# Patient Record
Sex: Female | Born: 1980 | Race: White | Hispanic: No | Marital: Married | Smoking: Current every day smoker
Health system: Southern US, Community
[De-identification: ages and names within clinical notes are randomized; demographics above are authoritative.]

## PROBLEM LIST (undated history)

## (undated) DIAGNOSIS — I10 Essential (primary) hypertension: Secondary | ICD-10-CM

## (undated) DIAGNOSIS — F99 Mental disorder, not otherwise specified: Secondary | ICD-10-CM

## (undated) HISTORY — DX: Mental disorder, not otherwise specified: F99

## (undated) HISTORY — DX: Essential (primary) hypertension: I10

---

## 2003-12-16 ENCOUNTER — Emergency Department (HOSPITAL_COMMUNITY): Admission: EM | Admit: 2003-12-16 | Discharge: 2003-12-16 | Payer: Self-pay | Admitting: Emergency Medicine

## 2005-11-23 IMAGING — CT CT PELVIS W/O CM
1 series · 15 of 32 positions shown, 19 images · non-contrast
Comparison: none

CLINICAL DATA: Left flank pain.
 CT ABDOMEN WITHOUT CONTRAST

[Series 3: — · axial · 0.62mm/px · z∈[-394,-66]mm · 15 of 91 slices shown, 19 images]
[im 6/91  soft-tissue]
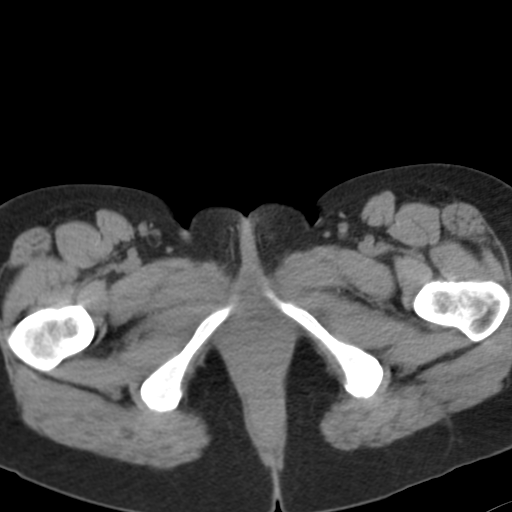
[im 6/91  bone]
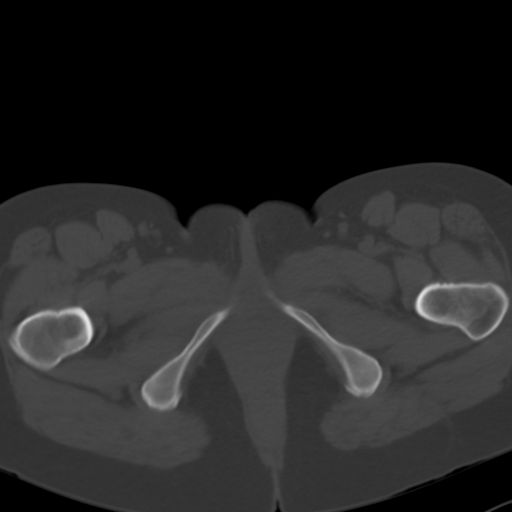
[im 12/91  soft-tissue]
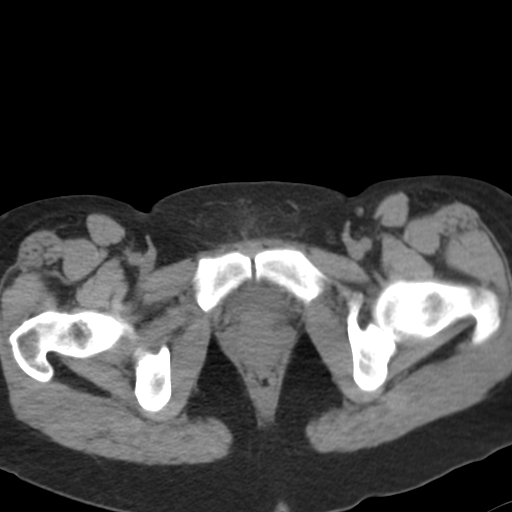
[im 18/91  soft-tissue]
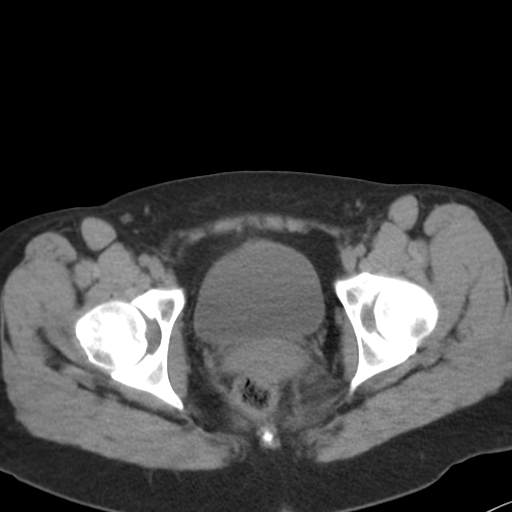
[im 27/91  soft-tissue]
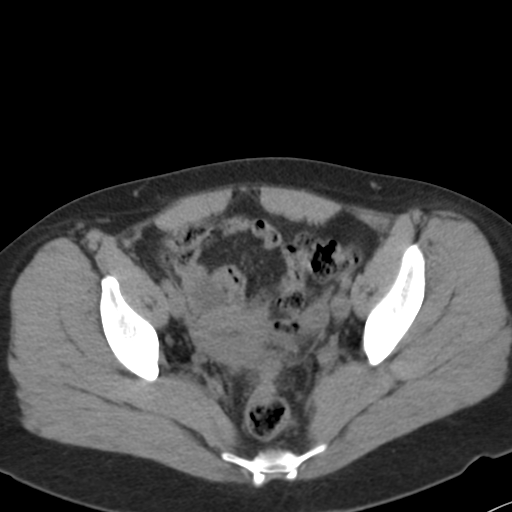
[im 32/91  soft-tissue]
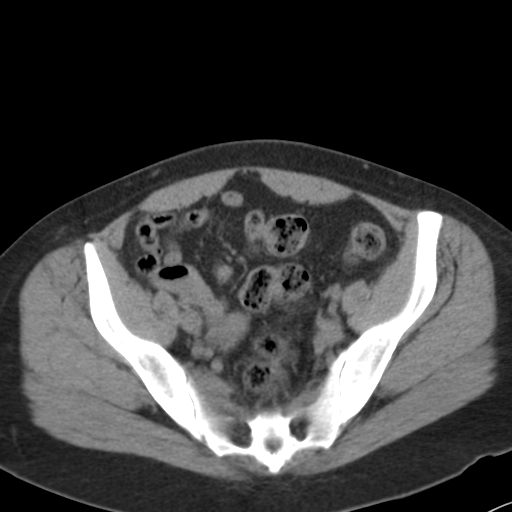
[im 38/91  soft-tissue]
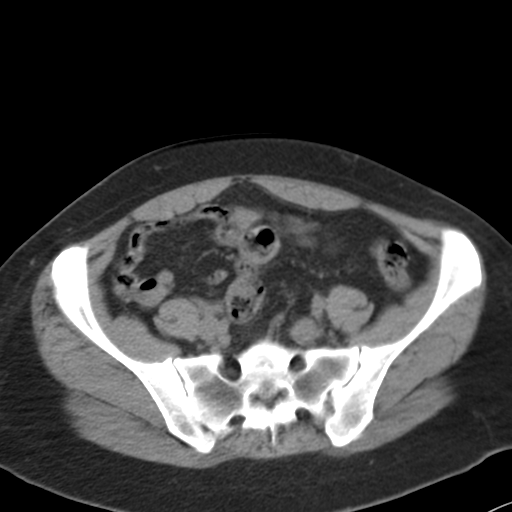
[im 47/91  soft-tissue]
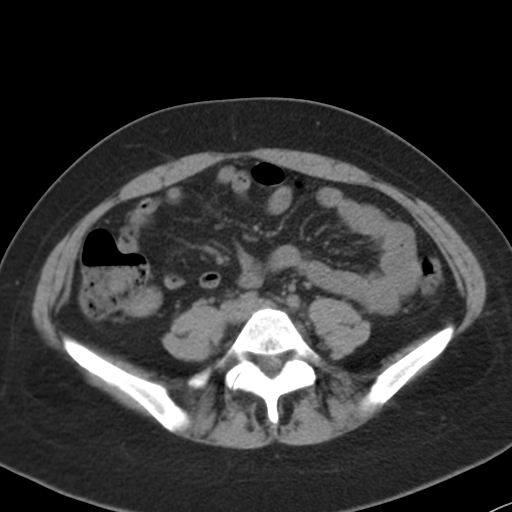
[im 53/91  soft-tissue]
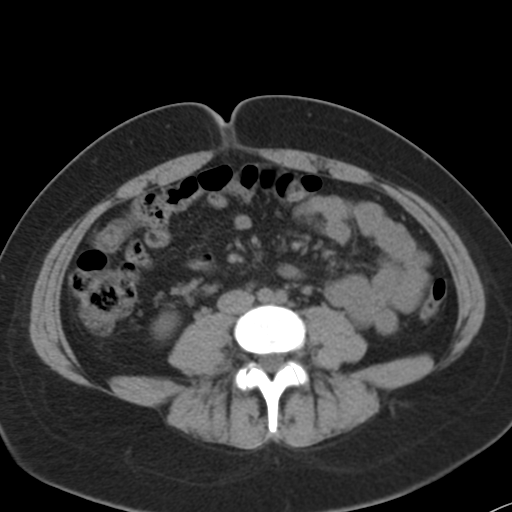
[im 59/91  soft-tissue]
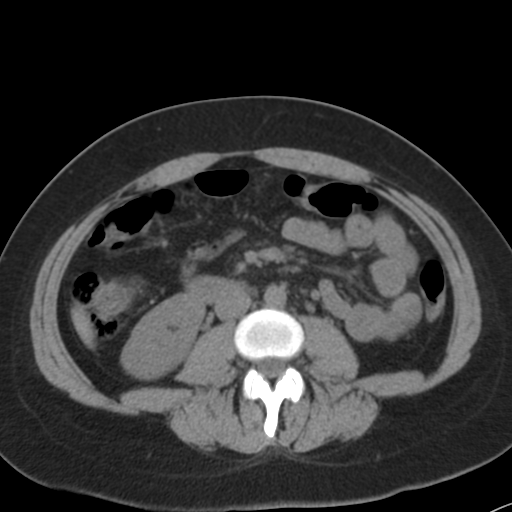
[im 59/91  bone]
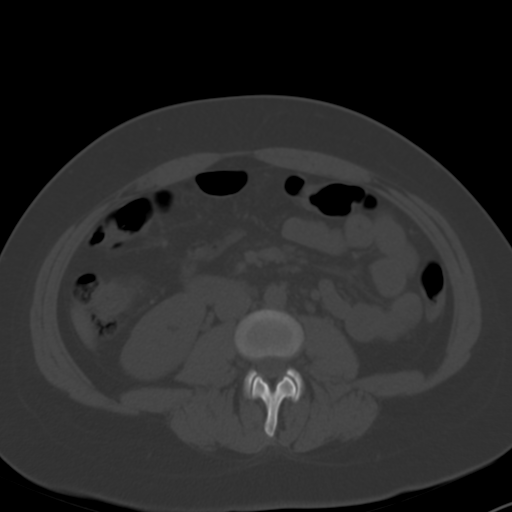
[im 64/91  soft-tissue]
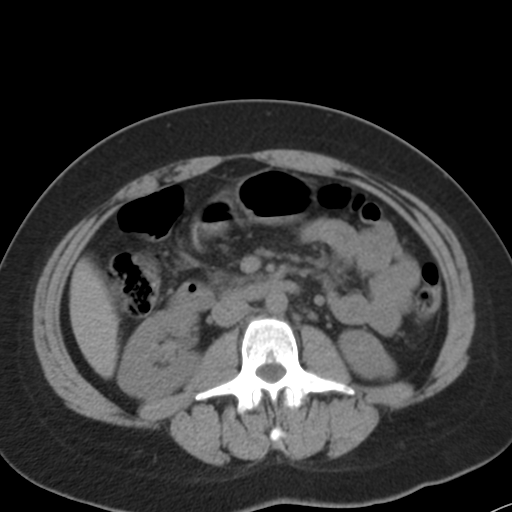
[im 73/91  soft-tissue]
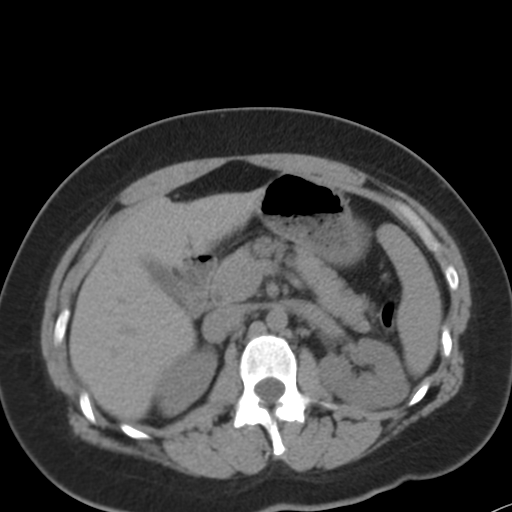
[im 79/91  soft-tissue]
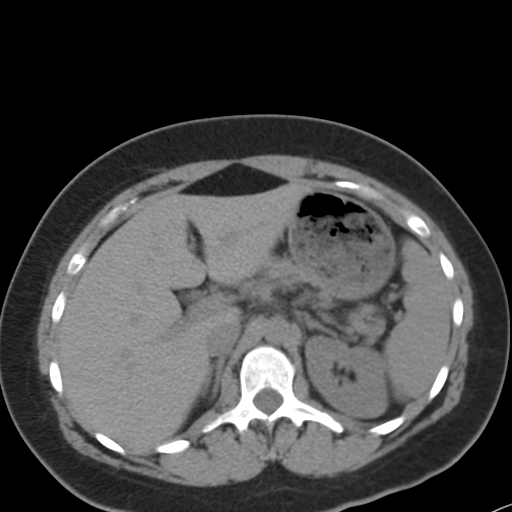
[im 79/91  lung]
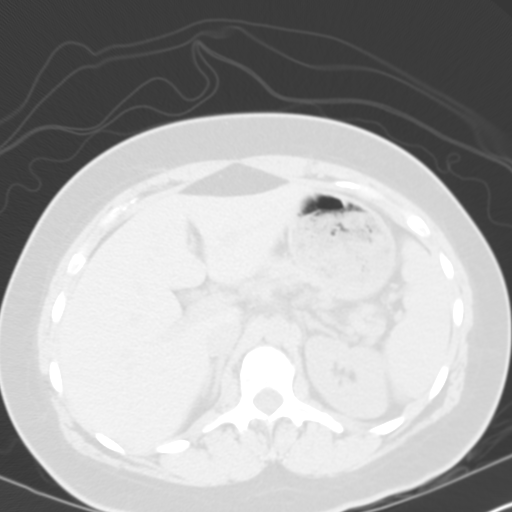
[im 82/91  lung]
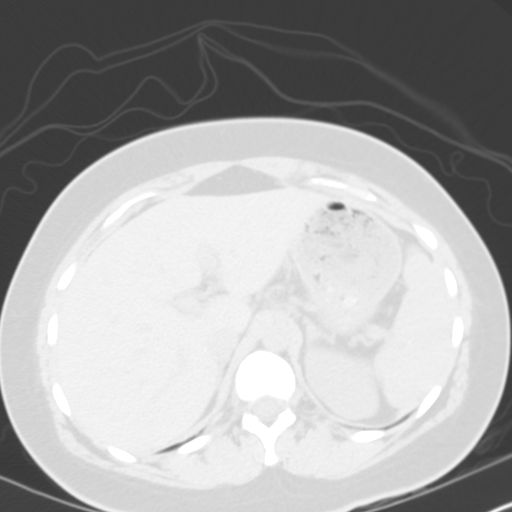
[im 85/91  soft-tissue]
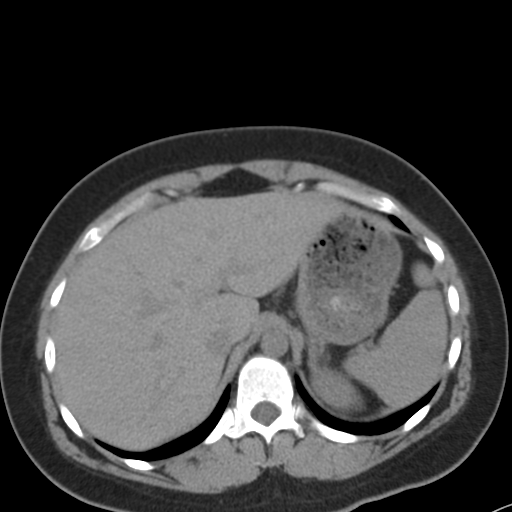
[im 85/91  lung]
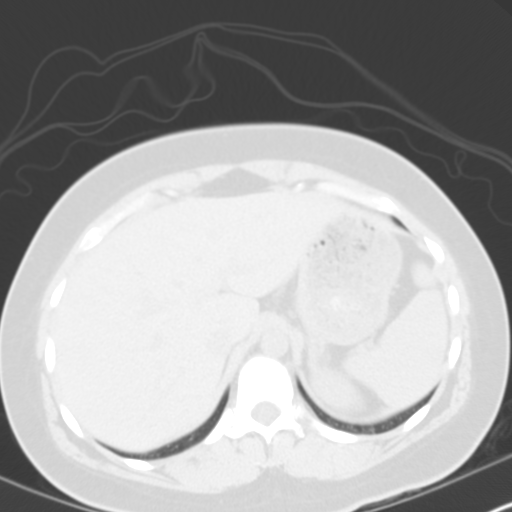
[im 88/91  lung]
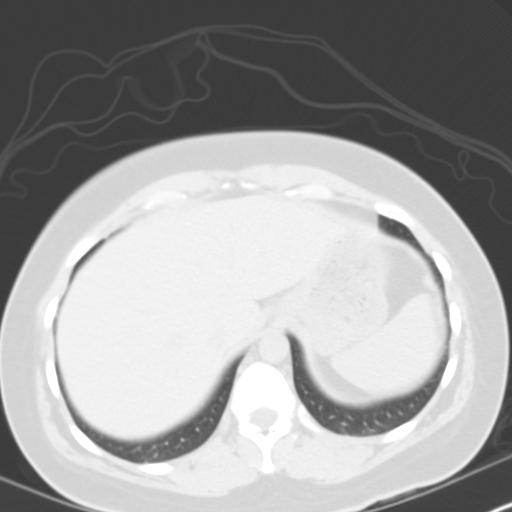

[15 of 32 positions shown; findings below may reference images not displayed]

FINDINGS: There is no previous for comparison.
 The kidneys are unremarkable without stone or hydronephrosis.  The proximal ureters are decompressed.  Unremarkable noncontrast evaluation of visualized portions of the liver and spleen, decompressed gallbladder, adrenal glands, and pancreas.  The aorta is normal in caliber.  The small bowel is decompressed.  No free air.  No ascites.  Left periaortic and aortocaval subcentimeter lymph nodes are incidentally noted.
 IMPRESSION
 Unremarkable CT of the abdomen.  Specifically, no evidence of renal calculus or hydronephrosis.
 CT PELVIS WITHOUT CONTRAST
FINDINGS: There is a phlebolith in the left pelvis.  There is a 2.8 cm ill-defined low attenuation area in the right adnexal region, possibly a cyst but incompletely evaluated.  The uterus and left adnexal region unremarkable.  No free fluid.  The urinary bladder is incompletely distended.  The colon is decompressed, unremarkable.  There is a single 7 mm lymph node projecting posterior to the cecum.  Subcentimeter inguinal lymph nodes are noted bilaterally.
 IMPRESSION
 1.  Possible right ovarian cyst versus mass.  Consider follow-up pelvic ultrasound to confirm appropriate resolution.
 2.  Negative for distal ureteral calculus.

## 2006-03-28 HISTORY — PX: CHOLECYSTECTOMY: SHX55

## 2007-11-30 ENCOUNTER — Ambulatory Visit: Payer: Self-pay | Admitting: Psychiatry

## 2007-11-30 ENCOUNTER — Inpatient Hospital Stay (HOSPITAL_COMMUNITY): Admission: AD | Admit: 2007-11-30 | Discharge: 2007-12-04 | Payer: Self-pay | Admitting: Psychiatry

## 2008-12-31 ENCOUNTER — Encounter: Payer: Self-pay | Admitting: Gastroenterology

## 2009-08-21 ENCOUNTER — Ambulatory Visit (HOSPITAL_COMMUNITY): Admission: RE | Admit: 2009-08-21 | Discharge: 2009-08-21 | Payer: Self-pay | Admitting: Obstetrics and Gynecology

## 2010-08-10 NOTE — H&P (Signed)
[name], [name]              ACCOUNT NO.:  [id number]   MEDICAL RECORD NO.:  [id number]          PATIENT TYPE:  IPS   LOCATION:  0501                          FACILITY:  [facility]   PHYSICIAN:  Geoffery Lyons, M.D.      DATE OF BIRTH:  May 30, 1980   DATE OF ADMISSION:  11/30/2007  DATE OF DISCHARGE:                       PSYCHIATRIC ADMISSION ASSESSMENT   IDENTIFYING INFORMATION:  This is a voluntary admission to the services  of Dr. Geoffery Lyons.  This is a 30 year old single white female.  She  presented at [facility] emergency department yesterday.  She stated that  when she gets depressed she stupid stuff.  She reportedly bought some  crack cocaine and used it yesterday.  Her boyfriend stated that if she  did not get help he would throw her out.  She admits that she no longer  wants to live and could not contract for safety at [facility] [place] [facility].  She admits  to being under a lot of stress with family issues and states that she  feels alone and abandoned.  She is thinking of ways to kill herself.  Apparently her mother who she was very, very close to moved to [place]  back in March.  Her only other admission psychiatrically was back in  March to [facility] and she was so depressed at that time that she cut  her wrist.  She also states that she is sleeping okay right now with  Seroquel and Ativan that is prescribed for her.   SOCIAL HISTORY:  She quit the ninth grade.  She got a diploma.  She has  never married.  She does have a 6 year old daughter.  Her last  employment was back in March at [employer].  She gets income from social  services.  She applied for disability.   FAMILY HISTORY:  Her mother was actually a patient here at [facility] about 8 months ago.   ALCOHOL AND DRUG HISTORY:  She states that she does not normally abuse  drugs.  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   PRIMARY CARE PHYSICIAN:  Dr.  Rennis Golden.  Psychiatrically she is followed by  Dr. Berneice Gandy at [facility] which is now [facility].   MEDICAL PROBLEMS:  She has fibromyalgia.   MEDICATIONS:  1. [facility] in [place] was called.  She is prescribed Seroquel 50      mg in the morning, 100 at bedtime, a fentanyl patch 25 mg every 72      hours not 50 like she stated.  2. Percocet 5/325 one q.6 hours p.r.n.  3. Ativan 2 mg b.i.d.  4. Wellbutrin XL 150 mg p.o. q. a.m.   DRUG ALLERGIES:  No known drug allergies.   POSITIVE PHYSICAL EXAM:  She was medically cleared in the ED at  [facility].  Her UDS was positive for cocaine and benzodiazepines but she  is prescribed the benzodiazepines.  She had no other remarkable findings  on physical examination.  Vital signs on admission to our unit show that  she is 60  inches tall.  She weighs 158.  Her temperature is 97.3, her  blood pressure was 124/73 to 132/93, pulse was 98 to 108, respirations  were 20.  She is also status post a cholecystectomy in the past.   MENTAL STATUS EXAM:  Today she is alert and oriented.  She was  appropriately groomed, dressed and nourished.  Her speech was not  pressured.  Her mood was appropriate to the situation.  Her affect was  euthymic.  She reports severe depression but her mood and affect are not  congruent with this report.  Thought processes were clear, rational and  goal oriented.  She would like to get her depression fixed.  Judgment  and insight are fair.  Concentration and memory are intact.  Impulsivity, she does respond impulsively to things.  She denies being  suicidal or homicidal right now.  She denies auditory visual  hallucinations.   AXIS I:  Crack abuse, depression and anxiety.  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  AXIS III:  Fibromyalgia.  AXIS IV:  Problems with primary support group, occupational, economic  issues.  AXIS V:  35.   PLAN:  She was admitted for safety and stabilization.  We will  have a  planning discharge session with her boyfriend.  We can increase her  Wellbutrin XL to 300 mg p.o. q. a.m.  Estimated length of stay is 3-5  days.      Mickie Leonarda Salon, P.A.-C.      Geoffery Lyons, M.D.  Electronically Signed    MD/MEDQ  D:  12/01/2007  T:  12/02/2007  Job:  [id number]

## 2010-08-13 NOTE — Discharge Summary (Signed)
[name], [name]              ACCOUNT NO.:  [id number]   MEDICAL RECORD NO.:  [id number]          PATIENT TYPE:  IPS   LOCATION:  0501                          FACILITY:  [facility]   PHYSICIAN:  Geoffery Lyons, M.D.      DATE OF BIRTH:  01-Sep-1980   DATE OF ADMISSION:  11/30/2007  DATE OF DISCHARGE:  12/04/2007                               DISCHARGE SUMMARY   CHIEF COMPLAINT/HISTORY OF PRESENT ILLNESS:  This was the first  admission to [facility] for this 30 year old single  white female presented to the ED.  Endorsed that when she gets depressed  she does stupid stuff.  Apparently, bought some crack cocaine and used  it.  Boyfriend stated that she if she did not get any help he would  throw her out.  Claimed that she did no longer want to live and could  not contract for safety at [facility].  Endorsed a lot of stress with  family issues, felt alone and abandoned.  Thinking of a way to kill  herself.  [place]   PAST PSYCHIATRIC HISTORY:  She was in [facility] in March was so the  depressed at that time and she claims that she cut her wrists according  to her serve report.  She had been prescribed Seroquel and Ativan.   SUBSTANCE ABUSE HISTORY:  Cocaine abuse, opiate dependence.   MEDICAL HISTORY:  Fibromyalgia.   MEDICATIONS:  1. Seroquel 50 in the morning and 100 at night.  2. Fentanyl patch 25 mcg every 72 hours.  3. Percocet 5/325 one every 6 hours as needed for breakthrough pain.  4. Ativan 2 mg twice a day.  5. Wellbutrin XL 150 the morning.   PHYSICAL EXAMINATION:  Failed to show any positive findings.   LABORATORY WORK:  UDS positive for cocaine and benzodiazepines.  Sodium  136, potassium 3.9, glucose 101 BUN 8, creatinine 0.68, SGOT 17, SGPT  20.  White blood cells 9.9, hemoglobin 14.8.   MENTAL STATUS EXAM:  Reveals an alert cooperative female appropriately  groomed, dressed and nourished.  Speech was normal  rate, tempo and  production.  Mood was anxious, depressed.  Affect was anxious,  depressed.  Thought process was clear, rational and goal oriented would  like to have her depression fixed.  Endorsed no active suicidal or  homicidal ideas, no evidence of delusions.  No hallucinations.  Cognition well-preserved.   ADMISSION DIAGNOSES:  AXIS I:  Cocaine abuse.  Depressive disorder, not  otherwise specified.  AXIS II:  No diagnosis.  AXIS III:  Fibromyalgia.  AXIS IV:  Moderate.  AXIS V:  On admission 35. Her GAF in the last year was 60.   COURSE IN THE HOSPITAL:  She was admitted.  She was started in  individual and group psychotherapy.  As already stated, she endorsed  having been very depressed and anxious, began to use cocaine.  Conflict  with the boyfriend who reportedly is getting fed up with her.  She was  on Wellbutrin, it was increased to 300.  On September  7, she was still  complaining of anxiety, mind raising, feeling depressed.  She was  wanting to try lithium as father has done well on lithium.  Family  session with the boyfriend September 8, concerns about her being by  herself upon discharge.  Apparently, the mother was going to come and  stay with her for a week.  She endorsed she was not wanting to do drugs  anymore.  She has a therapist in [place] that she can go back to any  time.  By September 8, she was in full contact with reality.  Endorsed  that she was feeling better.  The mother was supportive.  She was going  to come and stay with her.  Wellbutrin was increased from 150-300.  Lithium was added, so was Seroquel.  She was on the fentanyl patch.  Overall, she was feeling much better was denying any active suicidal or  homicidal ideations.  We went ahead and discharged her to outpatient  follow-up.   DISCHARGE DIAGNOSES:  AXIS I:  Cocaine abuse.  Depressive disorder, not  otherwise specified.  AXIS II:  No diagnosis.  AXIS III:  Fibromyalgia.  AXIS IV:  Moderate.  AXIS V:  On discharge 50-55.   Discharged on:  1. Wellbutrin XL 300 mg in the morning.  2. Seroquel 50 in the morning and 200 at bedtime.  3. Lithium 150 twice a day.  4. Fentanyl patch as per outpatient [place].   FOLLOW UP:  [facility] in [place].      Geoffery Lyons, M.D.  Electronically Signed     IL/MEDQ  D:  12/11/2007  T:  12/13/2007  Job:  [id number]

## 2014-03-28 HISTORY — PX: TONSILECTOMY, ADENOIDECTOMY, BILATERAL MYRINGOTOMY AND TUBES: SHX2538

## 2015-01-14 NOTE — Telephone Encounter (Signed)
This encounter was created in error - please disregard.

## 2015-06-30 DIAGNOSIS — F3181 Bipolar II disorder: Secondary | ICD-10-CM | POA: Diagnosis not present

## 2015-09-15 DIAGNOSIS — F3181 Bipolar II disorder: Secondary | ICD-10-CM | POA: Diagnosis not present

## 2015-11-18 DIAGNOSIS — F3181 Bipolar II disorder: Secondary | ICD-10-CM | POA: Diagnosis not present

## 2015-12-23 DIAGNOSIS — Z23 Encounter for immunization: Secondary | ICD-10-CM | POA: Diagnosis not present

## 2015-12-23 DIAGNOSIS — Z1389 Encounter for screening for other disorder: Secondary | ICD-10-CM | POA: Diagnosis not present

## 2015-12-23 DIAGNOSIS — Z0000 Encounter for general adult medical examination without abnormal findings: Secondary | ICD-10-CM | POA: Diagnosis not present

## 2015-12-23 DIAGNOSIS — E559 Vitamin D deficiency, unspecified: Secondary | ICD-10-CM | POA: Diagnosis not present

## 2015-12-23 DIAGNOSIS — R232 Flushing: Secondary | ICD-10-CM | POA: Diagnosis not present

## 2015-12-30 DIAGNOSIS — Z803 Family history of malignant neoplasm of breast: Secondary | ICD-10-CM | POA: Diagnosis not present

## 2015-12-30 DIAGNOSIS — Z1231 Encounter for screening mammogram for malignant neoplasm of breast: Secondary | ICD-10-CM | POA: Diagnosis not present

## 2016-01-06 DIAGNOSIS — F3181 Bipolar II disorder: Secondary | ICD-10-CM | POA: Diagnosis not present

## 2016-03-08 DIAGNOSIS — F3181 Bipolar II disorder: Secondary | ICD-10-CM | POA: Diagnosis not present

## 2016-06-29 DIAGNOSIS — R062 Wheezing: Secondary | ICD-10-CM | POA: Diagnosis not present

## 2016-06-29 DIAGNOSIS — J029 Acute pharyngitis, unspecified: Secondary | ICD-10-CM | POA: Diagnosis not present

## 2016-06-29 DIAGNOSIS — J324 Chronic pansinusitis: Secondary | ICD-10-CM | POA: Diagnosis not present

## 2016-07-13 DIAGNOSIS — F3181 Bipolar II disorder: Secondary | ICD-10-CM | POA: Diagnosis not present

## 2016-09-05 DIAGNOSIS — J22 Unspecified acute lower respiratory infection: Secondary | ICD-10-CM | POA: Diagnosis not present

## 2016-09-15 DIAGNOSIS — J029 Acute pharyngitis, unspecified: Secondary | ICD-10-CM | POA: Diagnosis not present

## 2016-09-15 DIAGNOSIS — Z720 Tobacco use: Secondary | ICD-10-CM | POA: Diagnosis not present

## 2016-09-29 DIAGNOSIS — J387 Other diseases of larynx: Secondary | ICD-10-CM | POA: Diagnosis not present

## 2016-09-29 DIAGNOSIS — Z720 Tobacco use: Secondary | ICD-10-CM | POA: Diagnosis not present

## 2016-09-29 DIAGNOSIS — J029 Acute pharyngitis, unspecified: Secondary | ICD-10-CM | POA: Diagnosis not present

## 2016-10-05 DIAGNOSIS — J387 Other diseases of larynx: Secondary | ICD-10-CM | POA: Diagnosis not present

## 2016-10-05 DIAGNOSIS — Z720 Tobacco use: Secondary | ICD-10-CM | POA: Diagnosis not present

## 2016-10-05 DIAGNOSIS — R1314 Dysphagia, pharyngoesophageal phase: Secondary | ICD-10-CM | POA: Diagnosis not present

## 2016-10-07 DIAGNOSIS — K219 Gastro-esophageal reflux disease without esophagitis: Secondary | ICD-10-CM | POA: Diagnosis not present

## 2016-10-07 DIAGNOSIS — J387 Other diseases of larynx: Secondary | ICD-10-CM | POA: Diagnosis not present

## 2016-10-07 DIAGNOSIS — J029 Acute pharyngitis, unspecified: Secondary | ICD-10-CM | POA: Diagnosis not present

## 2016-11-14 DIAGNOSIS — F3181 Bipolar II disorder: Secondary | ICD-10-CM | POA: Diagnosis not present

## 2017-05-10 DIAGNOSIS — F3181 Bipolar II disorder: Secondary | ICD-10-CM | POA: Diagnosis not present

## 2017-06-02 DIAGNOSIS — L0100 Impetigo, unspecified: Secondary | ICD-10-CM | POA: Diagnosis not present

## 2017-08-01 DIAGNOSIS — J069 Acute upper respiratory infection, unspecified: Secondary | ICD-10-CM | POA: Diagnosis not present

## 2017-08-01 DIAGNOSIS — J302 Other seasonal allergic rhinitis: Secondary | ICD-10-CM | POA: Diagnosis not present

## 2017-08-09 DIAGNOSIS — F3181 Bipolar II disorder: Secondary | ICD-10-CM | POA: Diagnosis not present

## 2017-08-10 DIAGNOSIS — E6609 Other obesity due to excess calories: Secondary | ICD-10-CM | POA: Diagnosis not present

## 2017-08-10 DIAGNOSIS — Z0189 Encounter for other specified special examinations: Secondary | ICD-10-CM | POA: Diagnosis not present

## 2017-08-10 DIAGNOSIS — R030 Elevated blood-pressure reading, without diagnosis of hypertension: Secondary | ICD-10-CM | POA: Diagnosis not present

## 2017-08-10 DIAGNOSIS — K219 Gastro-esophageal reflux disease without esophagitis: Secondary | ICD-10-CM | POA: Diagnosis not present

## 2017-08-14 DIAGNOSIS — R002 Palpitations: Secondary | ICD-10-CM | POA: Diagnosis not present

## 2017-11-08 DIAGNOSIS — M722 Plantar fascial fibromatosis: Secondary | ICD-10-CM | POA: Diagnosis not present

## 2017-11-08 DIAGNOSIS — Z6835 Body mass index (BMI) 35.0-35.9, adult: Secondary | ICD-10-CM | POA: Diagnosis not present

## 2017-11-08 DIAGNOSIS — M25571 Pain in right ankle and joints of right foot: Secondary | ICD-10-CM | POA: Diagnosis not present

## 2017-11-08 DIAGNOSIS — F3181 Bipolar II disorder: Secondary | ICD-10-CM | POA: Diagnosis not present

## 2017-11-08 DIAGNOSIS — M5431 Sciatica, right side: Secondary | ICD-10-CM | POA: Diagnosis not present

## 2017-11-15 DIAGNOSIS — M5417 Radiculopathy, lumbosacral region: Secondary | ICD-10-CM | POA: Diagnosis not present

## 2018-02-12 DIAGNOSIS — F3181 Bipolar II disorder: Secondary | ICD-10-CM | POA: Diagnosis not present

## 2019-04-11 DIAGNOSIS — Z712 Person consulting for explanation of examination or test findings: Secondary | ICD-10-CM | POA: Diagnosis not present

## 2019-04-11 DIAGNOSIS — E785 Hyperlipidemia, unspecified: Secondary | ICD-10-CM | POA: Diagnosis not present

## 2019-04-11 DIAGNOSIS — R431 Parosmia: Secondary | ICD-10-CM | POA: Diagnosis not present

## 2019-04-11 DIAGNOSIS — E559 Vitamin D deficiency, unspecified: Secondary | ICD-10-CM | POA: Diagnosis not present

## 2019-05-27 DIAGNOSIS — F3181 Bipolar II disorder: Secondary | ICD-10-CM | POA: Diagnosis not present

## 2019-06-19 DIAGNOSIS — F3181 Bipolar II disorder: Secondary | ICD-10-CM | POA: Diagnosis not present

## 2019-08-15 DIAGNOSIS — M67471 Ganglion, right ankle and foot: Secondary | ICD-10-CM | POA: Diagnosis not present

## 2019-09-18 DIAGNOSIS — F3181 Bipolar II disorder: Secondary | ICD-10-CM | POA: Diagnosis not present

## 2019-10-18 DIAGNOSIS — J00 Acute nasopharyngitis [common cold]: Secondary | ICD-10-CM | POA: Diagnosis not present

## 2019-10-18 DIAGNOSIS — J3489 Other specified disorders of nose and nasal sinuses: Secondary | ICD-10-CM | POA: Diagnosis not present

## 2019-10-18 DIAGNOSIS — Z20822 Contact with and (suspected) exposure to covid-19: Secondary | ICD-10-CM | POA: Diagnosis not present

## 2019-10-18 DIAGNOSIS — R05 Cough: Secondary | ICD-10-CM | POA: Diagnosis not present

## 2020-01-29 DIAGNOSIS — Z23 Encounter for immunization: Secondary | ICD-10-CM | POA: Diagnosis not present

## 2020-01-29 DIAGNOSIS — L290 Pruritus ani: Secondary | ICD-10-CM | POA: Diagnosis not present

## 2020-01-29 DIAGNOSIS — K219 Gastro-esophageal reflux disease without esophagitis: Secondary | ICD-10-CM | POA: Diagnosis not present

## 2020-01-29 DIAGNOSIS — K649 Unspecified hemorrhoids: Secondary | ICD-10-CM | POA: Diagnosis not present

## 2020-01-29 DIAGNOSIS — R21 Rash and other nonspecific skin eruption: Secondary | ICD-10-CM | POA: Diagnosis not present

## 2020-02-05 DIAGNOSIS — F3181 Bipolar II disorder: Secondary | ICD-10-CM | POA: Diagnosis not present

## 2020-03-12 DIAGNOSIS — Z03818 Encounter for observation for suspected exposure to other biological agents ruled out: Secondary | ICD-10-CM | POA: Diagnosis not present

## 2020-03-12 DIAGNOSIS — R5383 Other fatigue: Secondary | ICD-10-CM | POA: Diagnosis not present

## 2020-03-12 DIAGNOSIS — R070 Pain in throat: Secondary | ICD-10-CM | POA: Diagnosis not present

## 2020-03-12 DIAGNOSIS — J020 Streptococcal pharyngitis: Secondary | ICD-10-CM | POA: Diagnosis not present

## 2024-04-08 ENCOUNTER — Other Ambulatory Visit (HOSPITAL_COMMUNITY)
Admission: RE | Admit: 2024-04-08 | Discharge: 2024-04-08 | Disposition: A | Discharge status: Home / Self Care | Source: Ambulatory Visit | Attending: Obstetrics and Gynecology | Admitting: Obstetrics and Gynecology

## 2024-04-08 ENCOUNTER — Encounter: Payer: Self-pay | Admitting: Obstetrics and Gynecology

## 2024-04-08 ENCOUNTER — Ambulatory Visit: Payer: Self-pay | Admitting: Obstetrics and Gynecology

## 2024-04-08 VITALS — BP 159/97 | HR 113 | Ht 61.0 in | Wt 191.8 lb

## 2024-04-08 DIAGNOSIS — N939 Abnormal uterine and vaginal bleeding, unspecified: Secondary | ICD-10-CM | POA: Insufficient documentation

## 2024-04-08 DIAGNOSIS — N951 Menopausal and female climacteric states: Secondary | ICD-10-CM | POA: Diagnosis not present

## 2024-04-08 DIAGNOSIS — Z3202 Encounter for pregnancy test, result negative: Secondary | ICD-10-CM

## 2024-04-08 DIAGNOSIS — F419 Anxiety disorder, unspecified: Secondary | ICD-10-CM | POA: Diagnosis not present

## 2024-04-08 LAB — POCT URINE PREGNANCY: Preg Test, Ur: NEGATIVE

## 2024-04-08 NOTE — Progress Notes (Signed)
 AUB.  Last PAP 2-3 years ago; pt reports normal results.  Pt reports 3 periods in one month with a week in between occurring October thru December.   Medroxyprogesterone 10 mg (on 15 days off 15 days) currently OFF.  Pt BP today in office 168/95 and 159/97. Pt reports BP are normal when she checks at home, but always elevated when taken in office.   GAD of 6. Pt would like referral to counselor.  Pt reports mammogram 2024 and coloscopy 2024.   Would like to discuss perimenopause signs and symptoms and hormonal therapy.

## 2024-04-08 NOTE — Progress Notes (Addendum)
 "  GYNECOLOGY PROGRESS NOTE  History:  44 y.o. G3P3003 presents to [facility] for abnormal uterine bleeding. Two months straight had a period three times a month. Stop one week then come back, last bleeding lasted 12 days. Heavy whole 12 days. Took 15 days and has stopped bleeding Had ultrasound recently with PCP, as well as blood work  Prior to the last two months, normal periods She has a history of 3 cesarean sections. She is sexually active and  not using any method of contraception  She endorses brain fog, mood changes, hot flashes which she reports she has had majority of her life and she takes oxybutynin, gabapentin for nerve pain, no night sweats, but does endorse  morning sweats  Declines vaginal dryness  LMP first week of December   The following portions of the patient's history were reviewed and updated as appropriate: allergies, current medications, past family history, past medical history, past social history, past surgical history and problem list. Last pap smear on 2022 or 2023  Health Maintenance Due  Topic Date Due   HIV Screening  Never done   Hepatitis C Screening  Never done   DTaP/Tdap/Td (1 - Tdap) Never done   Pneumococcal Vaccine (1 of 2 - PCV) Never done   Hepatitis B Vaccines 19-59 Average Risk (1 of 3 - 19+ 3-dose series) Never done   HPV VACCINES (1 - 3-dose SCDM series) Never done   Cervical Cancer Screening (HPV/Pap Cotest)  Never done   Mammogram  Never done   Colonoscopy  Never done   Influenza Vaccine  10/27/2023   COVID-19 Vaccine (1 - 2025-26 season) Never done     Review of Systems:  Pertinent items are noted in HPI.   Objective:  Physical Exam Blood pressure (!) 159/97, pulse (!) 113, height 5' 1 (1.549 m), weight 191 lb 12.8 oz (87 kg), last menstrual period 03/01/2024. VS reviewed, nursing note reviewed,  Constitutional: well developed, well nourished, no distress HEENT: normocephalic Pulm/chest wall: normal effort Breast Exam:  deferred Abdomen: soft Neuro: alert and oriented  Skin: warm, dry Psych: affect normal Pelvic exam: Cervix pink, visually closed, without lesion, scant white creamy discharge, vaginal walls and external genitalia normal   ENDOMETRIAL BIOPSY     The indications for endometrial biopsy were reviewed.   Risks of the biopsy including cramping, bleeding, infection, uterine perforation, inadequate specimen and need for additional procedures were discussed. The patient states she understands the R/B/I/A and agrees to undergo procedure today. Urine pregnancy test was Negative. Consent was signed. Time out was performed.    Patient was positioned in dorsal lithotomy position. A vaginal speculum was placed.  The cervix was visualized and was prepped with Betadine.  A single-toothed tenaculum was placed on the anterior lip of the cervix to stabilize it. Cervical dilator utilized. The 3 mm pipelle was introduced into the endometrial cavity  to a depth of 6 cm, and a Moderate amount of tissue was obtained after one pass and sent to pathology. The instruments were removed from the patient's vagina. Minimal bleeding from the cervix was noted. The patient tolerated the procedure well.   Patient was given post procedure instructions.  Will follow up pathology and manage accordingly; patient will be contacted with results and recommendations.  Routine preventative health maintenance measures emphasized.    Assessment & Plan:  1. Abnormal uterine bleeding (Primary) Discussed differential for AUB to include fibroid, polyps, endometrial tumor, also discussed changes with perimenopause  EMB performed  today Discussed options for bleeding and need for contraception. Would do non-HT today given smoking and elevated blood pressure. She was considering IUD and after discussion desires to proceed with IUD.  Discussed IUD approved for 5 years for aub  2. Perimenopausal symptom Discussed non-ht options for perimenopausal  symptoms such as SSRI/SNRI, we discussed swithching her meds, gabapentin ( she's already on)  Recent intercourse, discussed return in two weeks, abstain prior to visit for IUD insertion  2. Anxiety  - Amb ref to [facility]    Future Appointments  Date Time Provider Department Center  04/30/2024  8:35 AM [place], [place] LABOR, CNM [facility] None     Nidia Daring, FNP 1:54 PM "

## 2024-04-10 LAB — SURGICAL PATHOLOGY

## 2024-04-16 ENCOUNTER — Ambulatory Visit: Payer: Self-pay | Admitting: Obstetrics and Gynecology

## 2024-04-30 ENCOUNTER — Ambulatory Visit: Payer: Self-pay | Admitting: Advanced Practice Midwife

## 2024-05-23 ENCOUNTER — Ambulatory Visit: Admitting: Family Medicine
